# Patient Record
Sex: Female | Born: 1980 | Race: White | Hispanic: No | Marital: Married | Smoking: Former smoker
Health system: Southern US, Community
[De-identification: ages and names within clinical notes are randomized; demographics above are authoritative.]

## PROBLEM LIST (undated history)

## (undated) DIAGNOSIS — E079 Disorder of thyroid, unspecified: Secondary | ICD-10-CM

---

## 2013-10-17 ENCOUNTER — Emergency Department: Payer: Self-pay | Admitting: Emergency Medicine

## 2013-10-17 LAB — COMPREHENSIVE METABOLIC PANEL
ALK PHOS: 67 U/L
ALT: 34 U/L (ref 12–78)
AST: 23 U/L (ref 15–37)
Albumin: 3.5 g/dL (ref 3.4–5.0)
Anion Gap: 4 — ABNORMAL LOW (ref 7–16)
BILIRUBIN TOTAL: 0.2 mg/dL (ref 0.2–1.0)
BUN: 10 mg/dL (ref 7–18)
CHLORIDE: 107 mmol/L (ref 98–107)
CREATININE: 0.86 mg/dL (ref 0.60–1.30)
Calcium, Total: 8.7 mg/dL (ref 8.5–10.1)
Co2: 25 mmol/L (ref 21–32)
EGFR (Non-African Amer.): >60
Glucose: 110 mg/dL — ABNORMAL HIGH (ref 65–99)
OSMOLALITY: 272 (ref 275–301)
Potassium: 3.9 mmol/L (ref 3.5–5.1)
Sodium: 136 mmol/L (ref 136–145)
Total Protein: 7.6 g/dL (ref 6.4–8.2)

## 2013-10-17 LAB — URINALYSIS, COMPLETE
BILIRUBIN, UR: NEGATIVE
Blood: NEGATIVE
GLUCOSE, UR: NEGATIVE mg/dL (ref 0–75)
KETONE: NEGATIVE
LEUKOCYTE ESTERASE: NEGATIVE
NITRITE: NEGATIVE
PH: 5 (ref 4.5–8.0)
Protein: NEGATIVE
RBC,UR: 1 /HPF (ref 0–5)
Specific Gravity: 1.023 (ref 1.003–1.030)
WBC UR: 2 /HPF (ref 0–5)

## 2013-10-17 LAB — CBC WITH DIFFERENTIAL/PLATELET
Basophil #: 0.1 10*3/uL (ref 0.0–0.1)
Basophil %: 0.6 %
Eosinophil #: 0.2 10*3/uL (ref 0.0–0.7)
Eosinophil %: 1.9 %
HCT: 37.6 % (ref 35.0–47.0)
HGB: 12.5 g/dL (ref 12.0–16.0)
LYMPHS PCT: 38.3 %
Lymphocyte #: 3.2 10*3/uL (ref 1.0–3.6)
MCH: 28.5 pg (ref 26.0–34.0)
MCHC: 33.2 g/dL (ref 32.0–36.0)
MCV: 86 fL (ref 80–100)
MONO ABS: 0.4 x10 3/mm (ref 0.2–0.9)
Monocyte %: 5.4 %
NEUTROS ABS: 4.5 10*3/uL (ref 1.4–6.5)
NEUTROS PCT: 53.8 %
PLATELETS: 271 10*3/uL (ref 150–440)
RBC: 4.38 10*6/uL (ref 3.80–5.20)
RDW: 12.9 % (ref 11.5–14.5)
WBC: 8.4 10*3/uL (ref 3.6–11.0)

## 2013-10-17 LAB — LIPASE, BLOOD: Lipase: 93 U/L (ref 73–393)

## 2013-10-17 LAB — WET PREP, GENITAL

## 2013-12-23 ENCOUNTER — Ambulatory Visit: Payer: Self-pay | Admitting: Emergency Medicine

## 2015-07-18 IMAGING — CT CT ABD-PELV W/ CM
2 of 4 series · 17 of 46 positions shown, 19 images · IV contrast (agent unspecified)
Comparison: Limited abdominal ultrasound October 17, 2013 at 1871
hours

CLINICAL DATA: Abdominal pain for 1 week, now worsening.

EXAM:
CT ABDOMEN AND PELVIS WITH CONTRAST
TECHNIQUE: Multidetector CT imaging of the abdomen and pelvis was performed
using the standard protocol following bolus administration of
intravenous contrast.
CONTRAST:  100 cc Xsovue-RMM

[Series 2: routine abd pel with · axial · 0.78mm/px · z∈[-1535,-1095]mm · 14 of 98 slices shown, 16 images]
[im 5/98  soft-tissue]
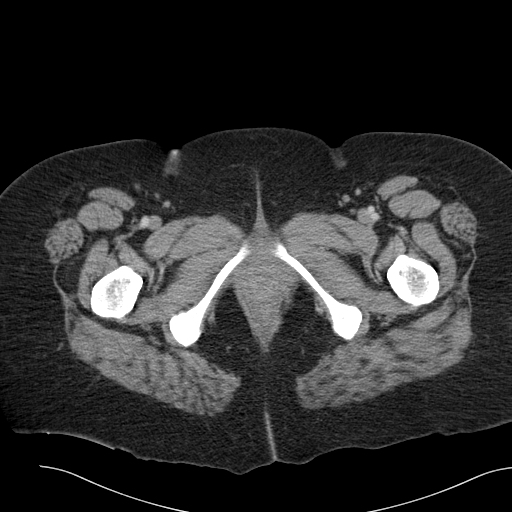
[im 5/98  bone]
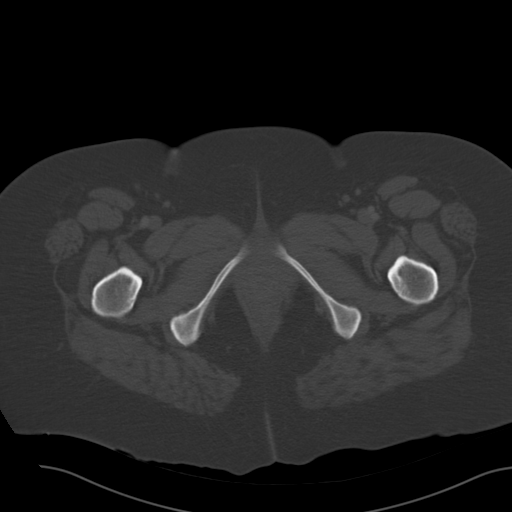
[im 13/98  soft-tissue]
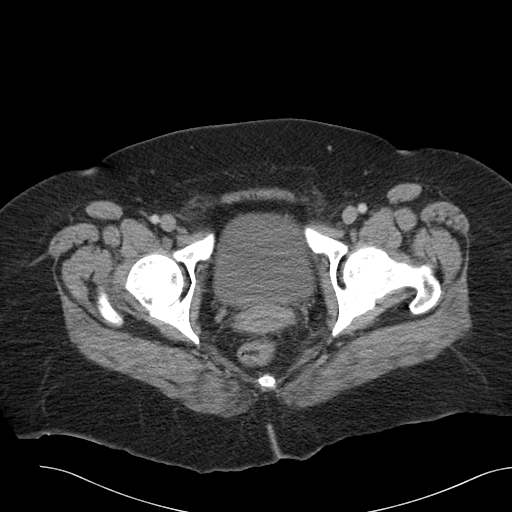
[im 21/98  soft-tissue]
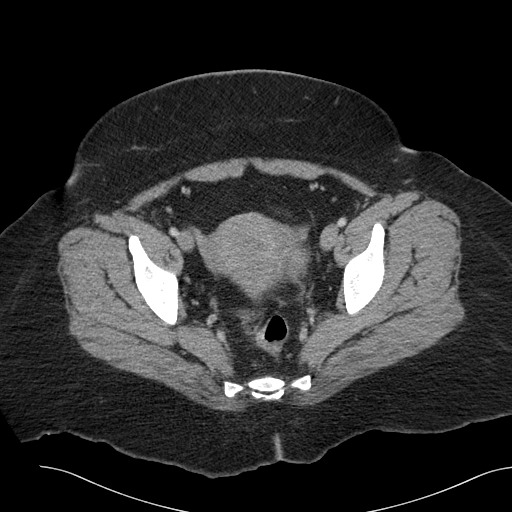
[im 25/98  soft-tissue]
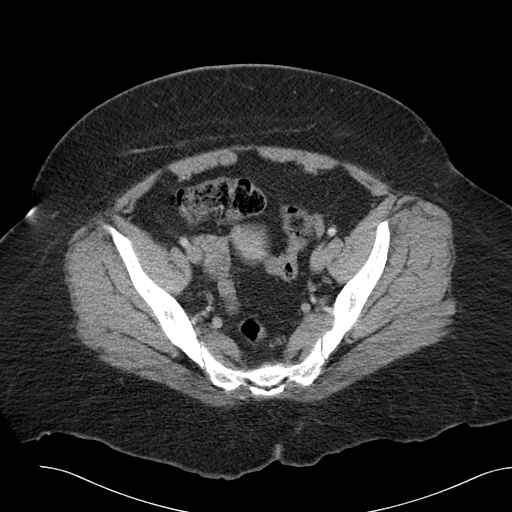
[im 33/98  soft-tissue]
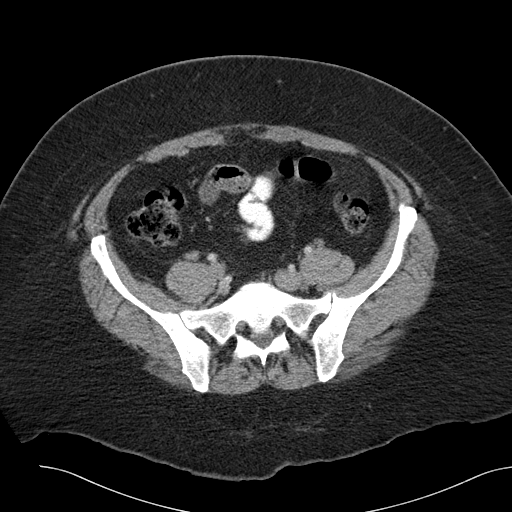
[im 41/98  soft-tissue]
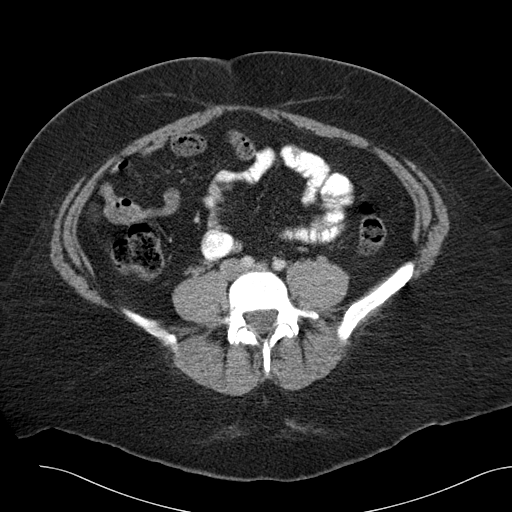
[im 45/98  soft-tissue]
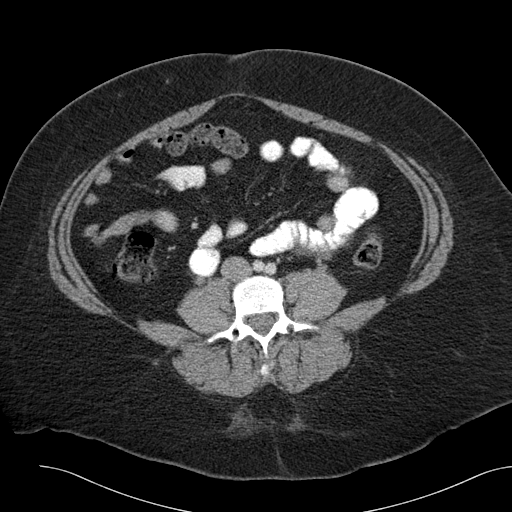
[im 53/98  soft-tissue]
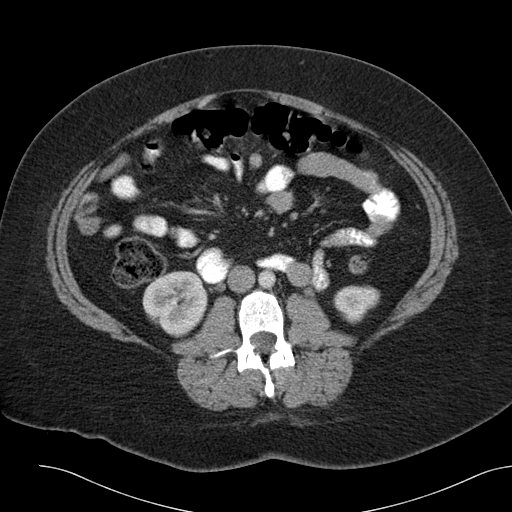
[im 57/98  soft-tissue]
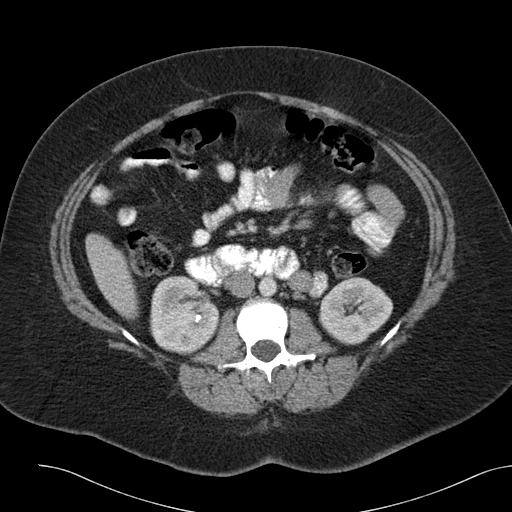
[im 57/98  bone]
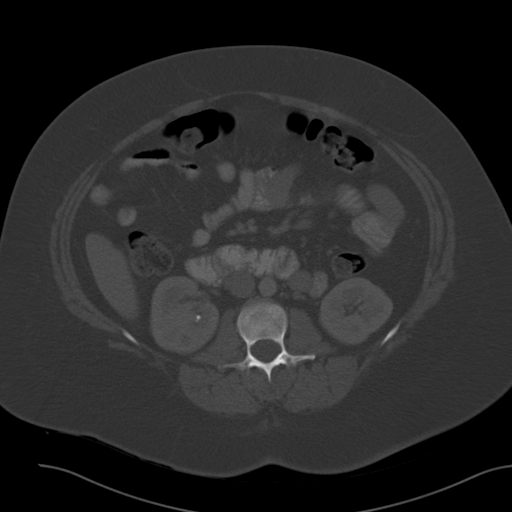
[im 65/98  soft-tissue]
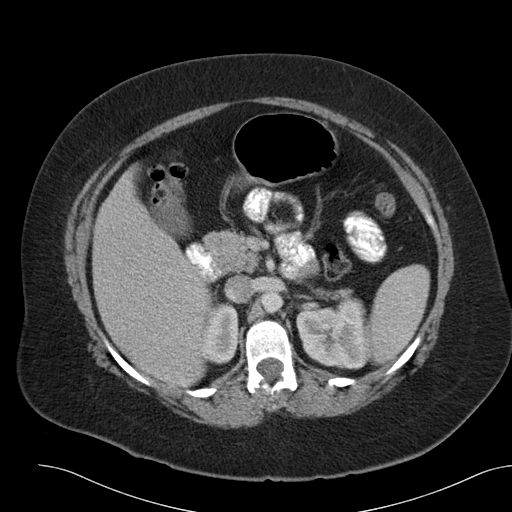
[im 73/98  soft-tissue]
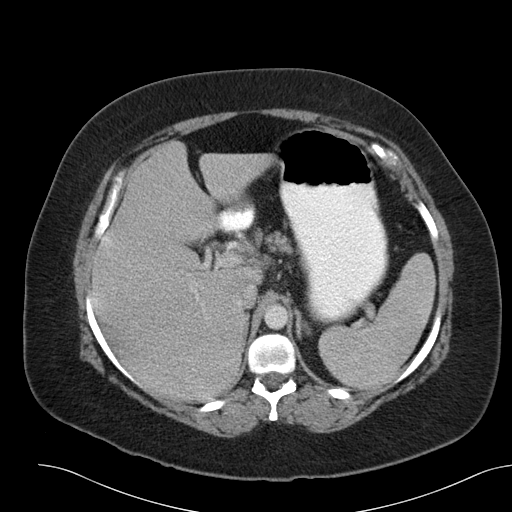
[im 77/98  soft-tissue]
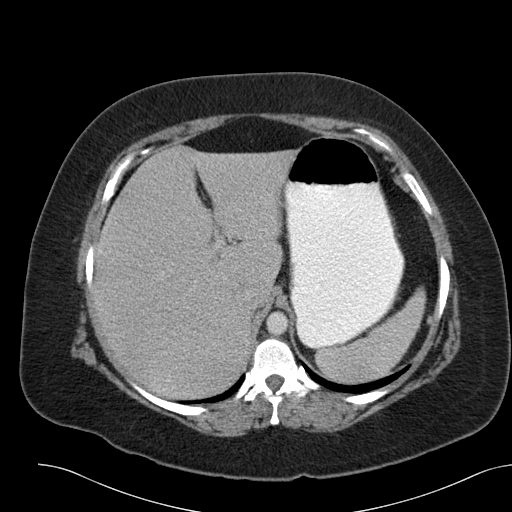
[im 85/98  soft-tissue]
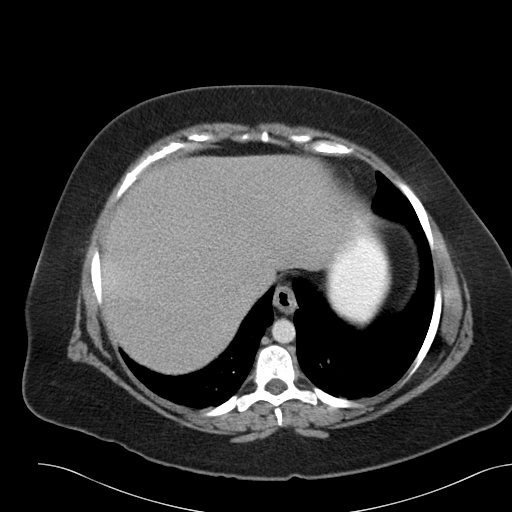
[im 93/98  soft-tissue]
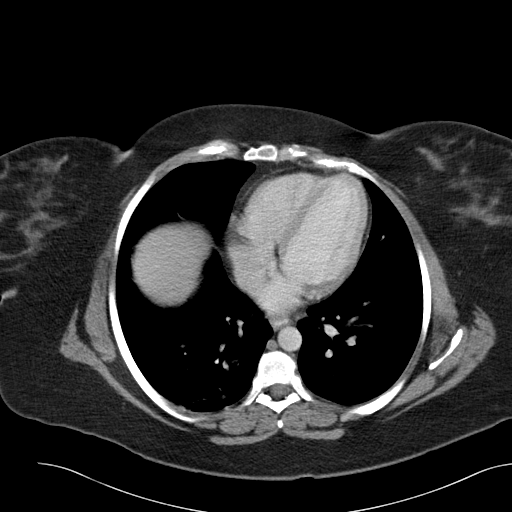

[Series 5: cor routine abd pel with · coronal · 1.01mm/px · 3 of 152 slices shown]
[im 51/152  soft-tissue]
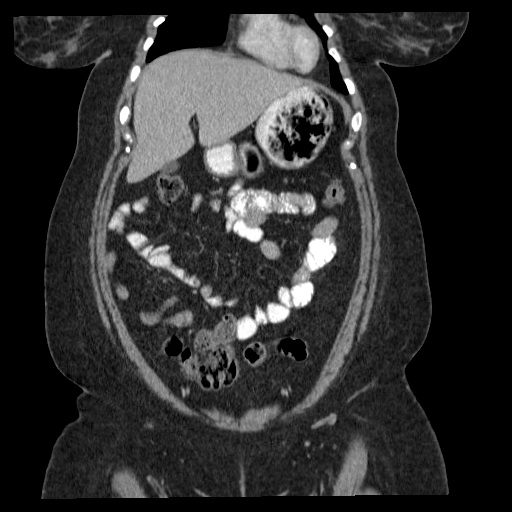
[im 68/152  soft-tissue]
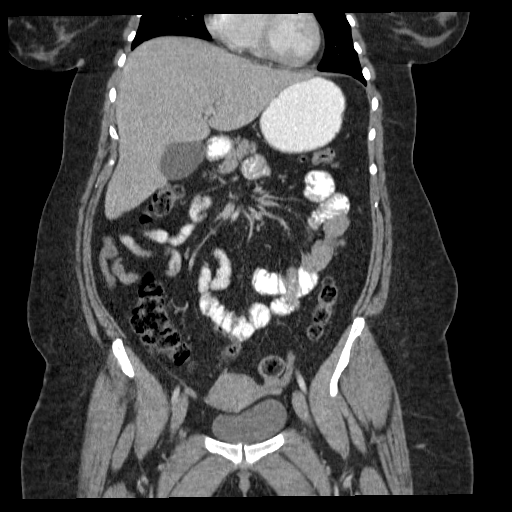
[im 84/152  soft-tissue]
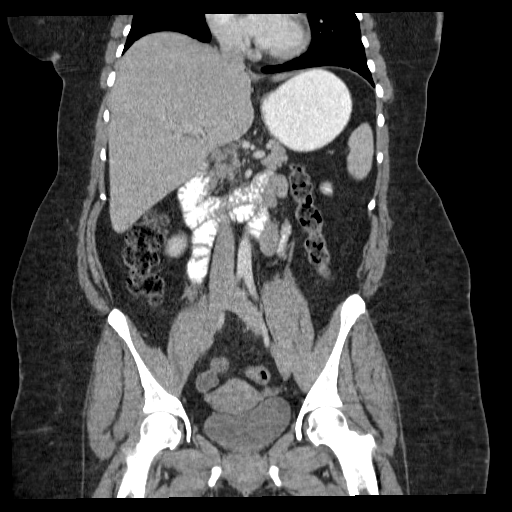

[17 of 46 positions shown; findings below may reference images not displayed]

FINDINGS: LUNG BASES: Included view of the lung bases demonstrate minimal
right lung base atelectasis. Visualized heart and pericardium are
unremarkable.

SOLID ORGANS: The liver, spleen, gallbladder, pancreas and adrenal
glands are unremarkable.

GASTROINTESTINAL TRACT: Small gas-filled hiatal hernia. The stomach,
small and large bowel are normal in course and caliber without
inflammatory changes. Enteric contrast has not yet reached the
distal small bowel. Normal appendix best characterized on the
coronal reformation is.

KIDNEYS/ URINARY TRACT: Kidneys are orthotopic, demonstrating
symmetric enhancement. 7 mm right interpolar, 6 mm left lower pole
nephrolithiasis. No hydronephrosis or renal masses. The unopacified
ureters are normal in course and caliber. Urinary bladder is
partially distended and unremarkable.

PERITONEUM/RETROPERITONEUM: No intraperitoneal free fluid nor free
air. Aortoiliac vessels are normal in course and caliber. No
lymphadenopathy by CT size criteria. Internal reproductive organs
are unremarkable.

SOFT TISSUE/OSSEOUS STRUCTURES: Nonsuspicious. Probable bone line on
the right femoral neck. Left iliac wing probable enchondroma.
IMPRESSION: Bilateral nonobstructing nephrolithiasis, measuring up to 7 mm on
the right.

No acute intra-abdominal or pelvic process; normal appendix.

  By: Hodan Fultz

## 2020-06-22 ENCOUNTER — Ambulatory Visit (INDEPENDENT_AMBULATORY_CARE_PROVIDER_SITE_OTHER): Payer: BC Managed Care – PPO

## 2020-06-22 ENCOUNTER — Ambulatory Visit
Admission: EM | Admit: 2020-06-22 | Discharge: 2020-06-22 | Disposition: A | Discharge status: Home / Self Care | Payer: BC Managed Care – PPO

## 2020-06-22 ENCOUNTER — Other Ambulatory Visit: Payer: Self-pay

## 2020-06-22 ENCOUNTER — Encounter: Payer: Self-pay | Admitting: Emergency Medicine

## 2020-06-22 DIAGNOSIS — M25562 Pain in left knee: Secondary | ICD-10-CM

## 2020-06-22 DIAGNOSIS — M25462 Effusion, left knee: Secondary | ICD-10-CM | POA: Diagnosis not present

## 2020-06-22 DIAGNOSIS — S83412A Sprain of medial collateral ligament of left knee, initial encounter: Secondary | ICD-10-CM

## 2020-06-22 HISTORY — DX: Disorder of thyroid, unspecified: E07.9

## 2020-06-22 MED ORDER — IBUPROFEN 600 MG PO TABS: 600.0000 mg | ORAL_TABLET | Freq: Four times a day (QID) | ORAL | 0 refills | Status: AC | PRN

## 2020-06-22 NOTE — ED Triage Notes (Signed)
Patient c/o swollen left knee that started on Saturday. She states she has an area right under the knee that is swollen and feels like fluid. Denies injury.

## 2020-06-22 NOTE — Discharge Instructions (Addendum)
Wear the compression sleeve to aid in supporting and help with inflammation.  Keep your left knee elevated as much as possible to decrease swelling and aid in healing.  Take ibuprofen 600 mg every 6 hours as needed with food to help with pain and inflammation.  Apply ice for the first 48 hours for 20 minutes at a time 2-3 times a day and then after that apply moist heat for 20 minutes at a time 2-3 times a day to help improved healing.  If your symptoms do not improve return for reevaluation or see your primary care provider.

## 2020-06-22 NOTE — ED Provider Notes (Addendum)
[facility]    CSN: [id number] Arrival date & time: 06/22/20  1531      History   Chief Complaint Chief Complaint  Patient presents with  . Knee Pain  . Joint Swelling    HPI Mary Moreno is a 40 y.o. female.   HPI   40 year old female here for evaluation of pain and swelling to her left knee.  Patient reports that she first noticed the pain and swelling 3 days ago.  Patient reports that she is not aware of any injury does not have any numbness or tingling in her left foot or toes.  She reports that the pain and swelling is mostly on the inside of the left knee and on the inside of the upper part of the left shin.  The area is warm but there is no redness or bruising.  Patient reports that she was wrestling with her sons the day before but does not remember injuring her knee at all.  Patient reports that she can walk on her knee but going up and down stairs increases her pain significantly.  Past Medical History:  Diagnosis Date  . Thyroid disease     There are no problems to display for this patient.   History reviewed. No pertinent surgical history.  OB History   No obstetric history on file.      Home Medications    Prior to Admission medications   Medication Sig Start Date End Date Taking? Authorizing Provider  ibuprofen (ADVIL) 600 MG tablet Take 1 tablet (600 mg total) by mouth every 6 (six) hours as needed. 06/22/20  Yes Becky Augusta, NP  levothyroxine (SYNTHROID) 200 MCG tablet Take by mouth. 05/12/20  Yes [provider]  escitalopram (LEXAPRO) 10 MG tablet Take by mouth.    [provider]    Family History History reviewed. No pertinent family history.  Social History Social History   Tobacco Use  . Smoking status: Former Games developer  . Smokeless tobacco: Never Used  Vaping Use  . Vaping Use: Never used  Substance Use Topics  . Alcohol use: Never  . Drug use: Never     Allergies   Patient has no known  allergies.   Review of Systems Review of Systems  Constitutional: Negative for activity change and appetite change.  Musculoskeletal: Positive for arthralgias, joint swelling and myalgias. Negative for gait problem.  Skin: Negative for color change.  Hematological: Negative.   Psychiatric/Behavioral: Negative.      Physical Exam Triage Vital Signs ED Triage Vitals  Enc Vitals Group     BP 06/22/20 1549 112/80     Pulse Rate 06/22/20 1549 85     Resp 06/22/20 1549 18     Temp 06/22/20 1549 98.2 F (36.8 C)     Temp Source 06/22/20 1549 Oral     SpO2 06/22/20 1549 98 %     Weight 06/22/20 1547 275 lb (124.7 kg)     Height 06/22/20 1547 5\' 2"  (1.575 m)     Head Circumference --      Peak Flow --      Pain Score 06/22/20 1547 7     Pain Loc --      Pain Edu? --      Excl. in GC? --    No data found.  Updated Vital Signs BP 112/80 (BP Location: Left Arm)   Pulse 85   Temp 98.2 F (36.8 C) (Oral)   Resp 18   Ht  5\' 2"  (1.575 m)   Wt 275 lb (124.7 kg)   SpO2 98%   BMI 50.30 kg/m   Visual Acuity Right Eye Distance:   Left Eye Distance:   Bilateral Distance:    Right Eye Near:   Left Eye Near:    Bilateral Near:     Physical Exam Vitals and nursing note reviewed.  Constitutional:      General: She is not in acute distress.    Appearance: Normal appearance. She is not ill-appearing.  Cardiovascular:     Rate and Rhythm: Normal rate and regular rhythm.     Pulses: Normal pulses.     Heart sounds: Normal heart sounds. No murmur heard. No gallop.   Pulmonary:     Effort: Pulmonary effort is normal.     Breath sounds: Normal breath sounds. No wheezing, rhonchi or rales.  Musculoskeletal:        General: Swelling and tenderness present. No deformity or signs of injury.  Skin:    General: Skin is warm and dry.     Capillary Refill: Capillary refill takes less than 2 seconds.     Findings: No bruising or erythema.  Neurological:     General: No focal deficit  present.     Mental Status: She is alert and oriented to person, place, and time.  Psychiatric:        Mood and Affect: Mood normal.        Behavior: Behavior normal.        Thought Content: Thought content normal.        Judgment: Judgment normal.      UC Treatments / Results  Labs (all labs ordered are listed, but only abnormal results are displayed) Labs Reviewed - No data to display  EKG   Radiology DG Knee Complete 4 Views Left  Result Date: 06/22/2020 CLINICAL DATA:  Swelling and pain in left knee for 3 days. No injury. EXAM: LEFT KNEE - COMPLETE 4+ VIEW COMPARISON:  None. FINDINGS: No evidence of fracture or dislocation. Possible trace effusion. No evidence of arthropathy or other focal bone abnormality. Soft tissues are unremarkable. IMPRESSION: 1. No acute displaced fracture or dislocation. 2. Possible trace left knee effusion. Electronically Signed   By: 06/24/2020 M.D.   On: 06/22/2020 16:48    Procedures Procedures (including critical care time)  Medications Ordered in UC Medications - No data to display  Initial Impression / Assessment and Plan / UC Course  I have reviewed the triage vital signs and the nursing notes.  Pertinent labs & imaging results that were available during my care of the patient were reviewed by me and considered in my medical decision making (see chart for details).   Patient is a very pleasant 40 year old female here for evaluation of left knee pain that started 3 days ago.  Patient does not member any injury but she states she is having pain on the inner aspect of the left knee and the superior aspect of the left tibia medially.  Patient does have edema without erythema or ecchymosis to the proximal and medial aspect of the left lower leg.  There is no induration or fluctuance present.  Patient also has tenderness when palpating the patella as well as when palpating along the distal aspect of the vastus lateralis.  Patient also has  tenderness when palpating along the medial joint line but no tenderness on palpating the popliteal fossa.  Patient has no pain with varus stress application but has  pain on the medial aspect of the left knee with valgus stress application.  Radiograph of left knee.  Left knee radiograph interpreted by me.  Interpretation: No evidence of fracture or dislocation on x-ray.  Normal anatomic alignment is maintained as well as joint space.  Awaiting radiology overread.  Radiology overread concurs with my interpretation.  We will discharge patient home with diagnosis of right knee sprain and treat with compression, elevation, anti-inflammatories, and home physical therapy.  Final Clinical Impressions(s) / UC Diagnoses   Final diagnoses:  Sprain of medial collateral ligament of left knee, initial encounter     Discharge Instructions     Wear the compression sleeve to aid in supporting and help with inflammation.  Keep your left knee elevated as much as possible to decrease swelling and aid in healing.  Take ibuprofen 600 mg every 6 hours as needed with food to help with pain and inflammation.  Apply ice for the first 48 hours for 20 minutes at a time 2-3 times a day and then after that apply moist heat for 20 minutes at a time 2-3 times a day to help improved healing.  If your symptoms do not improve return for reevaluation or see your primary care provider.    ED Prescriptions    Medication Sig Dispense Auth. Provider   ibuprofen (ADVIL) 600 MG tablet Take 1 tablet (600 mg total) by mouth every 6 (six) hours as needed. 30 tablet Becky Augusta, NP     PDMP not reviewed this encounter.   Becky Augusta, NP 06/22/20 1653    Becky Augusta, NP 06/22/20 1655

## 2020-10-08 ENCOUNTER — Other Ambulatory Visit: Payer: Self-pay

## 2020-10-08 ENCOUNTER — Ambulatory Visit
Admission: EM | Admit: 2020-10-08 | Discharge: 2020-10-08 | Disposition: A | Discharge status: Home / Self Care | Payer: BC Managed Care – PPO | Attending: Medical Oncology | Admitting: Medical Oncology

## 2020-10-08 ENCOUNTER — Encounter: Payer: Self-pay | Admitting: Emergency Medicine

## 2020-10-08 DIAGNOSIS — H60331 Swimmer's ear, right ear: Secondary | ICD-10-CM | POA: Diagnosis not present

## 2020-10-08 DIAGNOSIS — H66003 Acute suppurative otitis media without spontaneous rupture of ear drum, bilateral: Secondary | ICD-10-CM | POA: Diagnosis not present

## 2020-10-08 MED ORDER — NEOMYCIN-POLYMYXIN-HC 3.5-10000-1 OT SUSP: 4.0000 [drp] | Freq: Three times a day (TID) | OTIC | 0 refills | Status: AC

## 2020-10-08 MED ORDER — FLUCONAZOLE 150 MG PO TABS: 150.0000 mg | ORAL_TABLET | Freq: Every day | ORAL | 0 refills | Status: DC

## 2020-10-08 MED ORDER — AMOXICILLIN-POT CLAVULANATE 875-125 MG PO TABS: 1.0000 | ORAL_TABLET | Freq: Two times a day (BID) | ORAL | 0 refills | Status: DC

## 2020-10-08 NOTE — ED Provider Notes (Signed)
[facility]    CSN: [id number] Arrival date & time: 10/08/20  1502      History   Chief Complaint Chief Complaint  Patient presents with   Otalgia    right    HPI Mary Moreno is a 40 y.o. female.   HPI  Otalgia: Patient reports that she has been fighting off a summer cold for about 1 to 2 weeks.  Summer cold consists of nasal congestion, mild dry cough and some ear congestion as well.  She states that last night her right ear started to cause significant pain.  She states that she is having some trouble hearing out of this ear as well.  No reported fevers.  She has tried various over-the-counter medications and over-the-counter eardrops for symptoms without any improvement.  She states that the over-the-counter drops actually made symptoms significantly worse.  No known sick contacts.  She reports she took a home COVID test at the beginning of illness and it was negative.  Past Medical History:  Diagnosis Date   Thyroid disease     There are no problems to display for this patient.   History reviewed. No pertinent surgical history.  OB History   No obstetric history on file.      Home Medications    Prior to Admission medications   Medication Sig Start Date End Date Taking? Authorizing Provider  escitalopram (LEXAPRO) 10 MG tablet Take by mouth.   Yes [provider]  levothyroxine (SYNTHROID) 200 MCG tablet Take by mouth. 05/12/20  Yes [provider]  ibuprofen (ADVIL) 600 MG tablet Take 1 tablet (600 mg total) by mouth every 6 (six) hours as needed. 06/22/20   Becky Augusta, NP    Family History History reviewed. No pertinent family history.  Social History Social History   Tobacco Use   Smoking status: Former    Pack years: 0.00   Smokeless tobacco: Never  Vaping Use   Vaping Use: Never used  Substance Use Topics   Alcohol use: Never   Drug use: Never     Allergies   Patient has no known allergies.   Review of  Systems Review of Systems  As stated above in HPI Physical Exam Triage Vital Signs ED Triage Vitals  Enc Vitals Group     BP 10/08/20 1608 (!) 144/77     Pulse Rate 10/08/20 1608 75     Resp 10/08/20 1608 14     Temp 10/08/20 1608 97.9 F (36.6 C)     Temp Source 10/08/20 1608 Oral     SpO2 10/08/20 1608 100 %     Weight 10/08/20 1606 270 lb (122.5 kg)     Height 10/08/20 1606 5\' 2"  (1.575 m)     Head Circumference --      Peak Flow --      Pain Score 10/08/20 1606 9     Pain Loc --      Pain Edu? --      Excl. in GC? --    No data found.  Updated Vital Signs BP (!) 144/77 (BP Location: Left Arm)   Pulse 75   Temp 97.9 F (36.6 C) (Oral)   Resp 14   Ht 5\' 2"  (1.575 m)   Wt 270 lb (122.5 kg)   SpO2 100%   BMI 49.38 kg/m   Physical Exam Vitals and nursing note reviewed.  Constitutional:      General: She is not in acute distress.  Appearance: Normal appearance. She is not ill-appearing, toxic-appearing or diaphoretic.  HENT:     Head: Normocephalic and atraumatic.     Right Ear: External ear normal.     Left Ear: Ear canal and external ear normal.     Ears:     Comments: TMs with erythema and bulging bilaterally. The right external ear canal has erythema, edema and white debris.     Nose: Congestion present. No rhinorrhea.     Mouth/Throat:     Mouth: Mucous membranes are moist.     Pharynx: Oropharynx is clear. No oropharyngeal exudate or posterior oropharyngeal erythema.  Eyes:     Extraocular Movements: Extraocular movements intact.     Pupils: Pupils are equal, round, and reactive to light.  Cardiovascular:     Rate and Rhythm: Normal rate and regular rhythm.     Heart sounds: Normal heart sounds.  Pulmonary:     Effort: Pulmonary effort is normal.     Breath sounds: Normal breath sounds.  Musculoskeletal:     Cervical back: Neck supple.  Lymphadenopathy:     Cervical: Cervical adenopathy present.  Neurological:     Mental Status: She is alert.      UC Treatments / Results  Labs (all labs ordered are listed, but only abnormal results are displayed) Labs Reviewed - No data to display  EKG   Radiology No results found.  Procedures Procedures (including critical care time)  Medications Ordered in UC Medications - No data to display  Initial Impression / Assessment and Plan / UC Course  I have reviewed the triage vital signs and the nursing notes.  Pertinent labs & imaging results that were available during my care of the patient were reviewed by me and considered in my medical decision making (see chart for details).     New. Treating with Augmentin, diflucan per pt request for vaginal candidiasis which occurs with abxs, and otic drops.  Final Clinical Impressions(s) / UC Diagnoses   Final diagnoses:  None   Discharge Instructions   None    ED Prescriptions   None    PDMP not reviewed this encounter.   Rushie Chestnut, [place] 10/08/20 1643

## 2020-10-08 NOTE — ED Triage Notes (Signed)
Patient c/o sharp pain in her right ear that started last night.  Patient denies fever.

## 2022-09-17 ENCOUNTER — Ambulatory Visit: Admit: 2022-09-17 | Payer: BC Managed Care – PPO

## 2024-05-03 ENCOUNTER — Encounter: Payer: Self-pay | Admitting: Emergency Medicine

## 2024-05-03 ENCOUNTER — Ambulatory Visit
Admission: EM | Admit: 2024-05-03 | Discharge: 2024-05-03 | Disposition: A | Discharge status: Home / Self Care | Attending: Family Medicine | Admitting: Family Medicine

## 2024-05-03 DIAGNOSIS — B3731 Acute candidiasis of vulva and vagina: Secondary | ICD-10-CM | POA: Insufficient documentation

## 2024-05-03 DIAGNOSIS — N3000 Acute cystitis without hematuria: Secondary | ICD-10-CM | POA: Insufficient documentation

## 2024-05-03 LAB — POCT URINE DIPSTICK
Bilirubin, UA: NEGATIVE
Glucose, UA: NEGATIVE mg/dL
Ketones, POC UA: NEGATIVE mg/dL
Nitrite, UA: NEGATIVE
Protein Ur, POC: NEGATIVE mg/dL
Spec Grav, UA: 1.02
Urobilinogen, UA: 0.2 U/dL
pH, UA: 5.5

## 2024-05-03 MED ORDER — NITROFURANTOIN MONOHYD MACRO 100 MG PO CAPS: 100.0000 mg | ORAL_CAPSULE | Freq: Two times a day (BID) | ORAL | 0 refills | Status: AC

## 2024-05-03 MED ORDER — CLOTRIMAZOLE 1 % EX CREA: TOPICAL_CREAM | CUTANEOUS | 0 refills | Status: AC

## 2024-05-03 MED ORDER — FLUCONAZOLE 150 MG PO TABS: ORAL_TABLET | ORAL | 0 refills | Status: AC

## 2024-05-03 NOTE — ED Triage Notes (Signed)
 Patient reports vaginal itching and irritation, urinary frequency and dysuria that stared 3 days ago.

## 2024-05-03 NOTE — Discharge Instructions (Signed)
 UTI: Based on either symptoms or urinalysis, you may have a urinary tract infection. We will send the urine for culture and call with results in a few days. Begin antibiotics at this time. Your symptoms should be much improved over the next 2-3 days. Increase rest and fluid intake. If for some reason symptoms are worsening or not improving after a couple of days or the urine culture determines the antibiotics you are taking will not treat the infection, the antibiotics may be changed. Return or go to ER for fever, back pain, worsening urinary pain, discharge, increased blood in urine. May take Tylenol or Motrin OTC for pain relief or consider AZO if no contraindications   The most common types of vaginal infections are yeast infections and bacterial vaginosis. Neither of which are really considered to be sexually transmitted. Often a pH swab or wet prep is performed and if abnormal may reveal either type of infection. Begin metronidazole if prescribed for possible BV infection. If there is concern for yeast infection, fluconazole is often prescribed . Take this as directed. You may also apply topical miconazole (can be purchased OTC) externally for relief of itching. Increase rest and fluid intake. If labs sent out, we will call within 2-5 days with results and amend treatment if necessary. Always try to use pH balanced washes/wipes, urinate after intercourse, stay hydrated, and take probiotics if you are prone to vaginal infections. Return or see PCP or gynecologist for new/worsening infections.

## 2024-05-07 ENCOUNTER — Ambulatory Visit (HOSPITAL_COMMUNITY): Payer: Self-pay

## 2024-05-07 LAB — URINE CULTURE

## 2024-05-07 LAB — CERVICOVAGINAL ANCILLARY ONLY
Bacterial Vaginitis (gardnerella): NEGATIVE
Candida Glabrata: NEGATIVE
Candida Vaginitis: POSITIVE — AB
Comment: NEGATIVE
Comment: NEGATIVE
Comment: NEGATIVE
Comment: NEGATIVE
Trichomonas: NEGATIVE

## 2024-05-07 NOTE — ED Provider Notes (Signed)
 " [facility]    CSN: [id number] Arrival date & time: 05/03/24  0911      History   Chief Complaint Chief Complaint  Patient presents with   Dysuria   Vaginal Itching    HPI Mary Moreno is a 44 y.o. female presenting for approximately 3-day history of urinary frequency, mild dysuria, vaginal itching and irritation.  Denies vaginal odor.  States vaginal discharge is thick, white and clumpy.  She says it feels consistent with a yeast infection.  She also believes she might have a UTI.  No fever, chills, abdominal pain, flank pain or concern for STIs.  HPI  Past Medical History:  Diagnosis Date   Thyroid disease     There are no active problems to display for this patient.   History reviewed. No pertinent surgical history.  OB History   No obstetric history on file.      Home Medications    Prior to Admission medications  Medication Sig Start Date End Date Taking? Authorizing Provider  atorvastatin (LIPITOR) 10 MG tablet Take 10 mg by mouth daily. 12/25/23  Yes [provider]  Cholecalciferol 50 MCG (2000 UT) TABS Take 2,000 Units by mouth. 02/07/24 02/06/25 Yes [provider]  clotrimazole  (LOTRIMIN ) 1 % cream Apply to affected area 2 times daily 05/03/24  Yes Arvis Huxley B, PA-C  estradiol (VIVELLE-DOT) 0.1 MG/24HR patch Place 1 patch onto the skin. 02/18/24 02/17/25 Yes [provider]  fluconazole  (DIFLUCAN ) 150 MG tablet Take 1 tab po q72 hr prn 05/03/24  Yes Arvis Huxley B, PA-C  losartan (COZAAR) 25 MG tablet Take 25 mg by mouth daily. 12/25/23  Yes [provider]  meloxicam (MOBIC) 15 MG tablet Take 15 mg by mouth. 04/28/24 04/28/25 Yes [provider]  MOUNJARO 10 MG/0.5ML Pen SMARTSIG:0.5 Milliliter(s) SUB-Q Once a Week 04/19/24  Yes [provider]  nitrofurantoin , macrocrystal-monohydrate, (MACROBID ) 100 MG capsule Take 1 capsule (100 mg total) by mouth 2 (two) times daily. 05/03/24  Yes  Arvis Huxley B, PA-C  escitalopram (LEXAPRO) 10 MG tablet Take by mouth.    [provider]  ibuprofen  (ADVIL ) 600 MG tablet Take 1 tablet (600 mg total) by mouth every 6 (six) hours as needed. 06/22/20   Bernardino Ditch, NP  levothyroxine (SYNTHROID) 200 MCG tablet Take by mouth. 05/12/20   [provider]  neomycin -polymyxin-hydrocortisone (CORTISPORIN) 3.5-10000-1 OTIC suspension Place 4 drops into the right ear 3 (three) times daily. 10/08/20   Tonette Lauraine HERO, PA-C    Family History History reviewed. No pertinent family history.  Social History Social History[1]   Allergies   Peanut-containing drug products, Lactose, and Wheat   Review of Systems Review of Systems  Constitutional:  Negative for fever.  Gastrointestinal:  Negative for abdominal pain.  Genitourinary:  Positive for dysuria, frequency and vaginal discharge. Negative for flank pain, hematuria, urgency, vaginal bleeding and vaginal pain.  Musculoskeletal:  Negative for back pain.  Skin:  Negative for rash.     Physical Exam Triage Vital Signs ED Triage Vitals  Encounter Vitals Group     BP 05/03/24 0926 107/61     Girls Systolic BP Percentile --      Girls Diastolic BP Percentile --      Boys Systolic BP Percentile --      Boys Diastolic BP Percentile --      Pulse Rate 05/03/24 0926 77     Resp 05/03/24 0926 14     Temp 05/03/24 0926  97.6 F (36.4 C)     Temp Source 05/03/24 0926 Oral     SpO2 05/03/24 0926 98 %     Weight 05/03/24 0923 270 lb 1 oz (122.5 kg)     Height 05/03/24 0923 5' 2 (1.575 m)     Head Circumference --      Peak Flow --      Pain Score 05/03/24 0923 7     Pain Loc --      Pain Education --      Exclude from Growth Chart --    No data found.  Updated Vital Signs BP 107/61 (BP Location: Left Arm)   Pulse 77   Temp 97.6 F (36.4 C) (Oral)   Resp 14   Ht 5' 2 (1.575 m)   Wt 270 lb 1 oz (122.5 kg)   SpO2 98%   BMI 49.40 kg/m      Physical Exam Vitals  and nursing note reviewed.  Constitutional:      General: She is not in acute distress.    Appearance: Normal appearance. She is not ill-appearing or toxic-appearing.  HENT:     Head: Normocephalic and atraumatic.  Eyes:     General: No scleral icterus.       Right eye: No discharge.        Left eye: No discharge.     Conjunctiva/sclera: Conjunctivae normal.  Cardiovascular:     Rate and Rhythm: Normal rate and regular rhythm.     Heart sounds: Normal heart sounds.  Pulmonary:     Effort: Pulmonary effort is normal. No respiratory distress.     Breath sounds: Normal breath sounds.  Abdominal:     Palpations: Abdomen is soft.     Tenderness: There is no abdominal tenderness. There is no right CVA tenderness or left CVA tenderness.  Musculoskeletal:     Cervical back: Neck supple.  Skin:    General: Skin is dry.  Neurological:     General: No focal deficit present.     Mental Status: She is alert. Mental status is at baseline.     Motor: No weakness.     Gait: Gait normal.  Psychiatric:        Mood and Affect: Mood normal.        Behavior: Behavior normal.      UC Treatments / Results  Labs (all labs ordered are listed, but only abnormal results are displayed) Labs Reviewed  POCT URINE DIPSTICK - Abnormal; Notable for the following components:      Result Value   Clarity, UA cloudy (*)    Blood, UA trace-intact (*)    Leukocytes, UA Small (1+) (*)    All other components within normal limits  URINE CULTURE  CERVICOVAGINAL ANCILLARY ONLY    EKG   Radiology No results found.  Procedures Procedures (including critical care time)  Medications Ordered in UC Medications - No data to display  Initial Impression / Assessment and Plan / UC Course  I have reviewed the triage vital signs and the nursing notes.  Pertinent labs & imaging results that were available during my care of the patient were reviewed by me and considered in my medical decision making (see  chart for details).   44 year old female presents for dysuria, frequency, vaginal discharge and itching.  UA shows cloudy urine with trace RBCs and small leukocytes.  Will send for culture.  This consistent with UTI.  Will prescribe Macrobid  and amend treatment based on  culture if needed.  Patient elects to perform vaginal self swab.  Declines STI testing.  Suspect vaginal yeast infection.  Sent Diflucan  and clotrimazole .  May amend treatment if BV test is positive.  Supportive care.  Reviewed return precautions.   Final Clinical Impressions(s) / UC Diagnoses   Final diagnoses:  Acute cystitis without hematuria  Vaginal yeast infection     Discharge Instructions      UTI: Based on either symptoms or urinalysis, you may have a urinary tract infection. We will send the urine for culture and call with results in a few days. Begin antibiotics at this time. Your symptoms should be much improved over the next 2-3 days. Increase rest and fluid intake. If for some reason symptoms are worsening or not improving after a couple of days or the urine culture determines the antibiotics you are taking will not treat the infection, the antibiotics may be changed. Return or go to ER for fever, back pain, worsening urinary pain, discharge, increased blood in urine. May take Tylenol or Motrin  OTC for pain relief or consider AZO if no contraindications   The most common types of vaginal infections are yeast infections and bacterial vaginosis. Neither of which are really considered to be sexually transmitted. Often a pH swab or wet prep is performed and if abnormal may reveal either type of infection. Begin metronidazole if prescribed for possible BV infection. If there is concern for yeast infection, fluconazole  is often prescribed . Take this as directed. You may also apply topical miconazole (can be purchased OTC) externally for relief of itching. Increase rest and fluid intake. If labs sent out, we will call  within 2-5 days with results and amend treatment if necessary. Always try to use pH balanced washes/wipes, urinate after intercourse, stay hydrated, and take probiotics if you are prone to vaginal infections. Return or see PCP or gynecologist for new/worsening infections.     ED Prescriptions     Medication Sig Dispense Auth. Provider   nitrofurantoin , macrocrystal-monohydrate, (MACROBID ) 100 MG capsule Take 1 capsule (100 mg total) by mouth 2 (two) times daily. 10 capsule Arvis Huxley B, PA-C   fluconazole  (DIFLUCAN ) 150 MG tablet Take 1 tab po q72 hr prn 2 tablet Arvis Huxley B, PA-C   clotrimazole  (LOTRIMIN ) 1 % cream Apply to affected area 2 times daily 28 g Arvis Huxley NOVAK, PA-C      PDMP not reviewed this encounter.    [1]  Social History Tobacco Use   Smoking status: Former   Smokeless tobacco: Never  Vaping Use   Vaping status: Never Used  Substance Use Topics   Alcohol use: Never   Drug use: Never     Arvis Huxley NOVAK DEVONNA 05/07/24 [phone]  "
